# Patient Record
Sex: Male | Born: 1990 | Race: Black or African American | Hispanic: No | Marital: Single | Smoking: Current every day smoker
Health system: Southern US, Community
[De-identification: ages and names within clinical notes are randomized; demographics above are authoritative.]

## PROBLEM LIST (undated history)

## (undated) HISTORY — PX: ADENOIDECTOMY: SUR15

---
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
## 2014-03-15 ENCOUNTER — Encounter (HOSPITAL_COMMUNITY): Payer: Self-pay | Admitting: Emergency Medicine

## 2014-03-15 ENCOUNTER — Emergency Department (HOSPITAL_COMMUNITY): Payer: BC Managed Care – PPO

## 2014-03-15 ENCOUNTER — Emergency Department (HOSPITAL_COMMUNITY)
Admission: EM | Admit: 2014-03-15 | Discharge: 2014-03-15 | Disposition: A | Discharge status: Home / Self Care | Payer: BC Managed Care – PPO | Attending: Emergency Medicine | Admitting: Emergency Medicine

## 2014-03-15 DIAGNOSIS — R9389 Abnormal findings on diagnostic imaging of other specified body structures: Secondary | ICD-10-CM

## 2014-03-15 DIAGNOSIS — R1013 Epigastric pain: Secondary | ICD-10-CM | POA: Diagnosis present

## 2014-03-15 DIAGNOSIS — Z79899 Other long term (current) drug therapy: Secondary | ICD-10-CM | POA: Insufficient documentation

## 2014-03-15 DIAGNOSIS — Z880 Allergy status to penicillin: Secondary | ICD-10-CM | POA: Insufficient documentation

## 2014-03-15 DIAGNOSIS — R937 Abnormal findings on diagnostic imaging of other parts of musculoskeletal system: Secondary | ICD-10-CM | POA: Diagnosis not present

## 2014-03-15 DIAGNOSIS — Z720 Tobacco use: Secondary | ICD-10-CM | POA: Diagnosis not present

## 2014-03-15 LAB — COMPREHENSIVE METABOLIC PANEL
ALBUMIN: 3.8 g/dL (ref 3.5–5.2)
ALT: 23 U/L (ref 0–53)
AST: 18 U/L (ref 0–37)
Alkaline Phosphatase: 75 U/L (ref 39–117)
Anion gap: 12 (ref 5–15)
BILIRUBIN TOTAL: 0.7 mg/dL (ref 0.3–1.2)
BUN: 11 mg/dL (ref 6–23)
CHLORIDE: 103 meq/L (ref 96–112)
CO2: 23 meq/L (ref 19–32)
Calcium: 9.4 mg/dL (ref 8.4–10.5)
Creatinine, Ser: 0.9 mg/dL (ref 0.50–1.35)
GLUCOSE: 87 mg/dL (ref 70–99)
Potassium: 4.2 mEq/L (ref 3.7–5.3)
Sodium: 138 mEq/L (ref 137–147)
TOTAL PROTEIN: 8.2 g/dL (ref 6.0–8.3)

## 2014-03-15 LAB — CBC WITH DIFFERENTIAL/PLATELET
Basophils Absolute: 0 10*3/uL (ref 0.0–0.1)
Basophils Relative: 0 % (ref 0–1)
Eosinophils Absolute: 0.2 10*3/uL (ref 0.0–0.7)
Eosinophils Relative: 5 % (ref 0–5)
HEMATOCRIT: 44.6 % (ref 39.0–52.0)
HEMOGLOBIN: 15.5 g/dL (ref 13.0–17.0)
LYMPHS PCT: 52 % — AB (ref 12–46)
Lymphs Abs: 2.8 10*3/uL (ref 0.7–4.0)
MCH: 32 pg (ref 26.0–34.0)
MCHC: 34.8 g/dL (ref 30.0–36.0)
MCV: 92.1 fL (ref 78.0–100.0)
Monocytes Absolute: 0.7 10*3/uL (ref 0.1–1.0)
Monocytes Relative: 13 % — ABNORMAL HIGH (ref 3–12)
NEUTROS ABS: 1.6 10*3/uL — AB (ref 1.7–7.7)
Neutrophils Relative %: 30 % — ABNORMAL LOW (ref 43–77)
PLATELETS: 262 10*3/uL (ref 150–400)
RBC: 4.84 MIL/uL (ref 4.22–5.81)
RDW: 12.5 % (ref 11.5–15.5)
WBC: 5.4 10*3/uL (ref 4.0–10.5)

## 2014-03-15 MED ORDER — DICYCLOMINE HCL 20 MG PO TABS: 20.0000 mg | ORAL_TABLET | Freq: Two times a day (BID) | ORAL | Status: AC

## 2014-03-15 MED ORDER — FAMOTIDINE 20 MG PO TABS: 20.0000 mg | ORAL_TABLET | Freq: Two times a day (BID) | ORAL | Status: AC

## 2014-03-15 NOTE — ED Provider Notes (Signed)
Medical screening examination/treatment/procedure(s) were performed by non-physician practitioner and as supervising physician I was immediately available for consultation/collaboration.   EKG Interpretation None       Arby BarretteMarcy Leyda Vanderwerf, MD 03/15/14 [phone]

## 2014-03-15 NOTE — ED Provider Notes (Signed)
CSN: [id number]     Arrival date & time 03/15/14  1357 History   First MD Initiated Contact with Patient 03/15/14 1423     Chief Complaint  Patient presents with  . Abdominal Pain     (Consider location/radiation/quality/duration/timing/severity/associated sxs/prior Treatment) HPI  Tyler Garcia is a(n) 23 y.o. male who presents to the ED with CC of epigastric abdominal pain. Patient states that it began yesterday after he took a mucinex on an empty stomach. He rates it as 6/10, aching, non-radiating. It has improved today and is now a 2-10, however the patient missed work and will need a work note. He denies any nausea, vomiting, diarrhea or constipation. .Denies fevers, chills, myalgias, arthralgias. Denies DOE, SOB, chest tightness or pressure, radiation to left arm, jaw or back, or diaphoresis. Denies dysuria, flank pain, suprapubic pain, frequency, urgency, or hematuria. Denies headaches, light headedness, weakness, visual disturbances.  History reviewed. No pertinent past medical history. History reviewed. No pertinent past surgical history. History reviewed. No pertinent family history. History  Substance Use Topics  . Smoking status: Current Every Day Smoker  . Smokeless tobacco: Not on file  . Alcohol Use: No    Review of Systems  Ten systems reviewed and are negative for acute change, except as noted in the HPI.     Allergies  Penicillins  Home Medications   Prior to Admission medications   Medication Sig Start Date End Date Taking? Authorizing Provider  dextromethorphan-guaiFENesin (MUCINEX DM) 30-600 MG per 12 hr tablet Take 1 tablet by mouth 2 (two) times daily.   Yes Historical Provider, MD  pseudoephedrine (SUDAFED) 30 MG tablet Take 30 mg by mouth every 4 (four) hours as needed for congestion.   Yes Historical Provider, MD   BP 131/77  Pulse 68  Temp(Src) 98 F (36.7 C)  Resp 18  SpO2 100% Physical Exam Physical Exam  Nursing note and vitals  reviewed. Constitutional: He appears well-developed and well-nourished. No distress.  HENT:  Head: Normocephalic and atraumatic.  Eyes: Conjunctivae normal are normal. No scleral icterus.  Neck: Normal range of motion. Neck supple.  Cardiovascular: Normal rate, regular rhythm and normal heart sounds.   Pulmonary/Chest: Effort normal and breath sounds normal. No respiratory distress.  Abdominal: Soft. There is no tenderness.  Musculoskeletal: He exhibits no edema.  Neurological: He is alert.  Skin: Skin is warm and dry. He is not diaphoretic.  Psychiatric: His behavior is normal.    ED Course  Procedures (including critical care time) Labs Review Labs Reviewed  CBC WITH DIFFERENTIAL - Abnormal; Notable for the following:    Neutrophils Relative % 30 (*)    Neutro Abs 1.6 (*)    Lymphocytes Relative 52 (*)    Monocytes Relative 13 (*)    All other components within normal limits  COMPREHENSIVE METABOLIC PANEL    Imaging Review Dg Abd Acute W/chest  03/15/2014   CLINICAL DATA:  Epigastric and umbilical abdominal pain. Cough and gas congestion for 3 days. History of smoking. Initial encounter.  EXAM: ACUTE ABDOMEN SERIES (ABDOMEN 2 VIEW & CHEST 1 VIEW)  COMPARISON:  None.  FINDINGS: Normal cardiac silhouette and mediastinal contours. No focal parenchymal opacities. No pleural effusion or pneumothorax. No evidence of edema.  Paucity of bowel gas without evidence of obstruction no pneumoperitoneum, pneumatosis or portal venous gas.  No definite abnormal intra-abdominal calcifications.  No acute osseus abnormalities. Punctate lucency with surrounding sclerosis within the right femoral neck.  IMPRESSION: 1.  No acute cardiopulmonary disease.  2. Paucity of bowel gas without evidence of obstruction 3. Punctate lucency with surrounding sclerosis within the right femoral neck, potentially artifactual though on osteoid osteoma could have a similar appearance. Correlation for right hip pain is  recommended. Further evaluation could be performed with nonemergent right hip radiographs as clinically indicated.   Electronically Signed   By: Simonne Come M.D.   On: 03/15/2014 14:49     EKG Interpretation None      MDM   Final diagnoses:  Abdominal discomfort, epigastric  Abnormal radiographic examination    3:40 PM BP 129/82  Pulse 55  Temp(Src) 99.3 F (37.4 C) (Oral)  Resp 16  SpO2 100% Patient lab work up wnl. Xray with questionable lesion of R hip. No pain Patient informed to follow up for repeat radiograph. WORK NOTE TO RETURN. Bentyl and Pepcid at discharge. Return precaution discussed.     Arthor Captain, PA-C 03/15/14 1542

## 2014-03-15 NOTE — ED Notes (Signed)
Pt c/o mid abd pain since yesterday.  States some constipation.  Last BM was this morning but it was hard to go.  Denies NVD.

## 2014-03-15 NOTE — Discharge Instructions (Signed)

## 2014-08-19 ENCOUNTER — Emergency Department (HOSPITAL_COMMUNITY)
Admission: EM | Admit: 2014-08-19 | Discharge: 2014-08-19 | Discharge status: Against Medical Advice | Payer: BLUE CROSS/BLUE SHIELD | Attending: Emergency Medicine | Admitting: Emergency Medicine

## 2014-08-19 ENCOUNTER — Encounter (HOSPITAL_COMMUNITY): Payer: Self-pay

## 2014-08-19 DIAGNOSIS — R1110 Vomiting, unspecified: Secondary | ICD-10-CM | POA: Insufficient documentation

## 2014-08-19 DIAGNOSIS — Z720 Tobacco use: Secondary | ICD-10-CM | POA: Insufficient documentation

## 2014-08-19 DIAGNOSIS — R197 Diarrhea, unspecified: Secondary | ICD-10-CM | POA: Diagnosis not present

## 2014-08-19 LAB — CBC WITH DIFFERENTIAL/PLATELET
Basophils Absolute: 0 10*3/uL (ref 0.0–0.1)
Basophils Relative: 0 % (ref 0–1)
EOS PCT: 0 % (ref 0–5)
Eosinophils Absolute: 0 10*3/uL (ref 0.0–0.7)
HEMATOCRIT: 51.5 % (ref 39.0–52.0)
Hemoglobin: 17.7 g/dL — ABNORMAL HIGH (ref 13.0–17.0)
LYMPHS ABS: 0.3 10*3/uL — AB (ref 0.7–4.0)
LYMPHS PCT: 3 % — AB (ref 12–46)
MCH: 32.9 pg (ref 26.0–34.0)
MCHC: 34.4 g/dL (ref 30.0–36.0)
MCV: 95.7 fL (ref 78.0–100.0)
MONO ABS: 0.7 10*3/uL (ref 0.1–1.0)
Monocytes Relative: 7 % (ref 3–12)
Neutro Abs: 8.7 10*3/uL — ABNORMAL HIGH (ref 1.7–7.7)
Neutrophils Relative %: 90 % — ABNORMAL HIGH (ref 43–77)
Platelets: 211 10*3/uL (ref 150–400)
RBC: 5.38 MIL/uL (ref 4.22–5.81)
RDW: 12.9 % (ref 11.5–15.5)
WBC: 9.7 10*3/uL (ref 4.0–10.5)

## 2014-08-19 LAB — COMPREHENSIVE METABOLIC PANEL
ALBUMIN: 4.5 g/dL (ref 3.5–5.2)
ALT: 35 U/L (ref 0–53)
ANION GAP: 7 (ref 5–15)
AST: 25 U/L (ref 0–37)
Alkaline Phosphatase: 69 U/L (ref 39–117)
BUN: 19 mg/dL (ref 6–23)
CHLORIDE: 106 mmol/L (ref 96–112)
CO2: 25 mmol/L (ref 19–32)
Calcium: 9.1 mg/dL (ref 8.4–10.5)
Creatinine, Ser: 0.95 mg/dL (ref 0.50–1.35)
GFR calc Af Amer: >90 mL/min (ref >90)
GFR calc non Af Amer: >90 mL/min (ref >90)
Glucose, Bld: 99 mg/dL (ref 70–99)
POTASSIUM: 4.3 mmol/L (ref 3.5–5.1)
Sodium: 138 mmol/L (ref 135–145)
Total Bilirubin: 1.1 mg/dL (ref 0.3–1.2)
Total Protein: 8.3 g/dL (ref 6.0–8.3)

## 2014-08-19 LAB — LIPASE, BLOOD: LIPASE: 21 U/L (ref 11–59)

## 2014-08-19 NOTE — ED Notes (Signed)
Pt called to room w/o answer x 1.

## 2014-08-19 NOTE — ED Notes (Signed)
Pt c/o emesis and diarrhea starting around 0730.  Denies pain.  Pt denies being around anyone sick.  Pt attempted to take OTC medication, but vomited it up.

## 2014-08-19 NOTE — ED Notes (Signed)
Bed: WA04 Expected date:  Expected time:  Means of arrival:  Comments: Hold for [place] **Triage**

## 2014-08-19 NOTE — ED Notes (Signed)
Attempted to room Pt w/o response several times.  Pt did not make anyone aware that he was leaving.

## 2014-08-19 NOTE — ED Notes (Signed)
Attempted to call Pt to room x 2 w/o response.

## 2016-03-21 IMAGING — CR DG ABDOMEN ACUTE W/ 1V CHEST
3 series · 3 of 3 positions shown · non-contrast
Comparison: None.

CLINICAL DATA: Epigastric and umbilical abdominal pain. Cough and
gas congestion for 3 days. History of smoking. Initial encounter.

EXAM:
ACUTE ABDOMEN SERIES (ABDOMEN 2 VIEW & CHEST 1 VIEW)

[w chest pa]
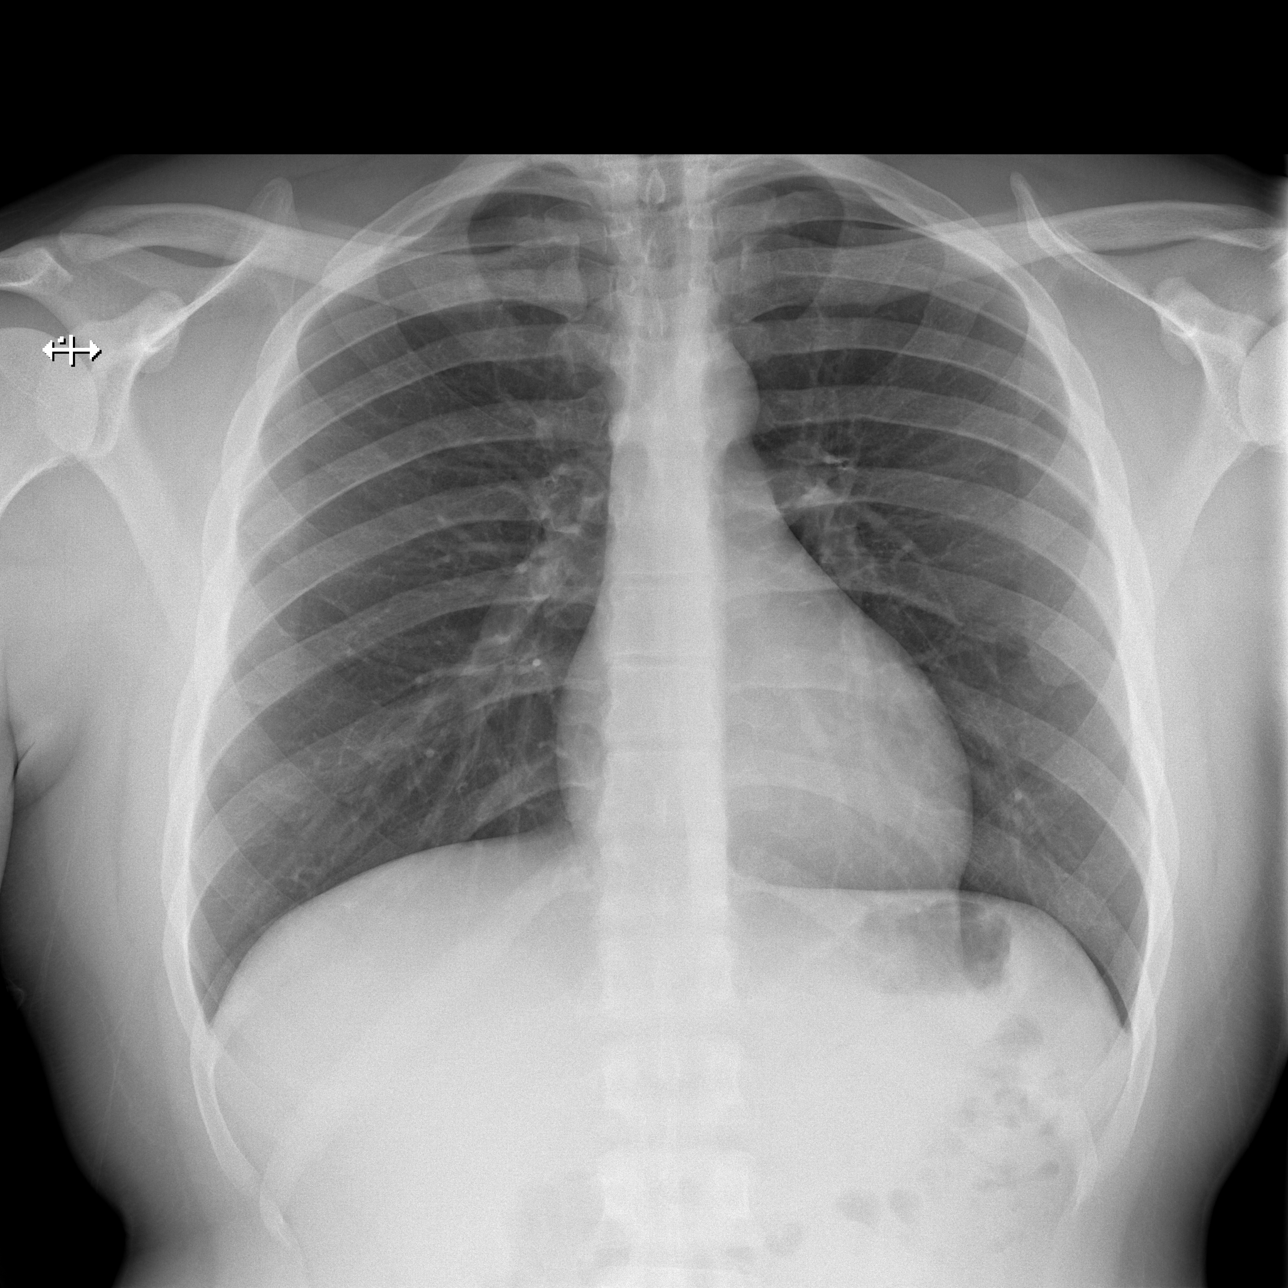

[w abdomen upright]
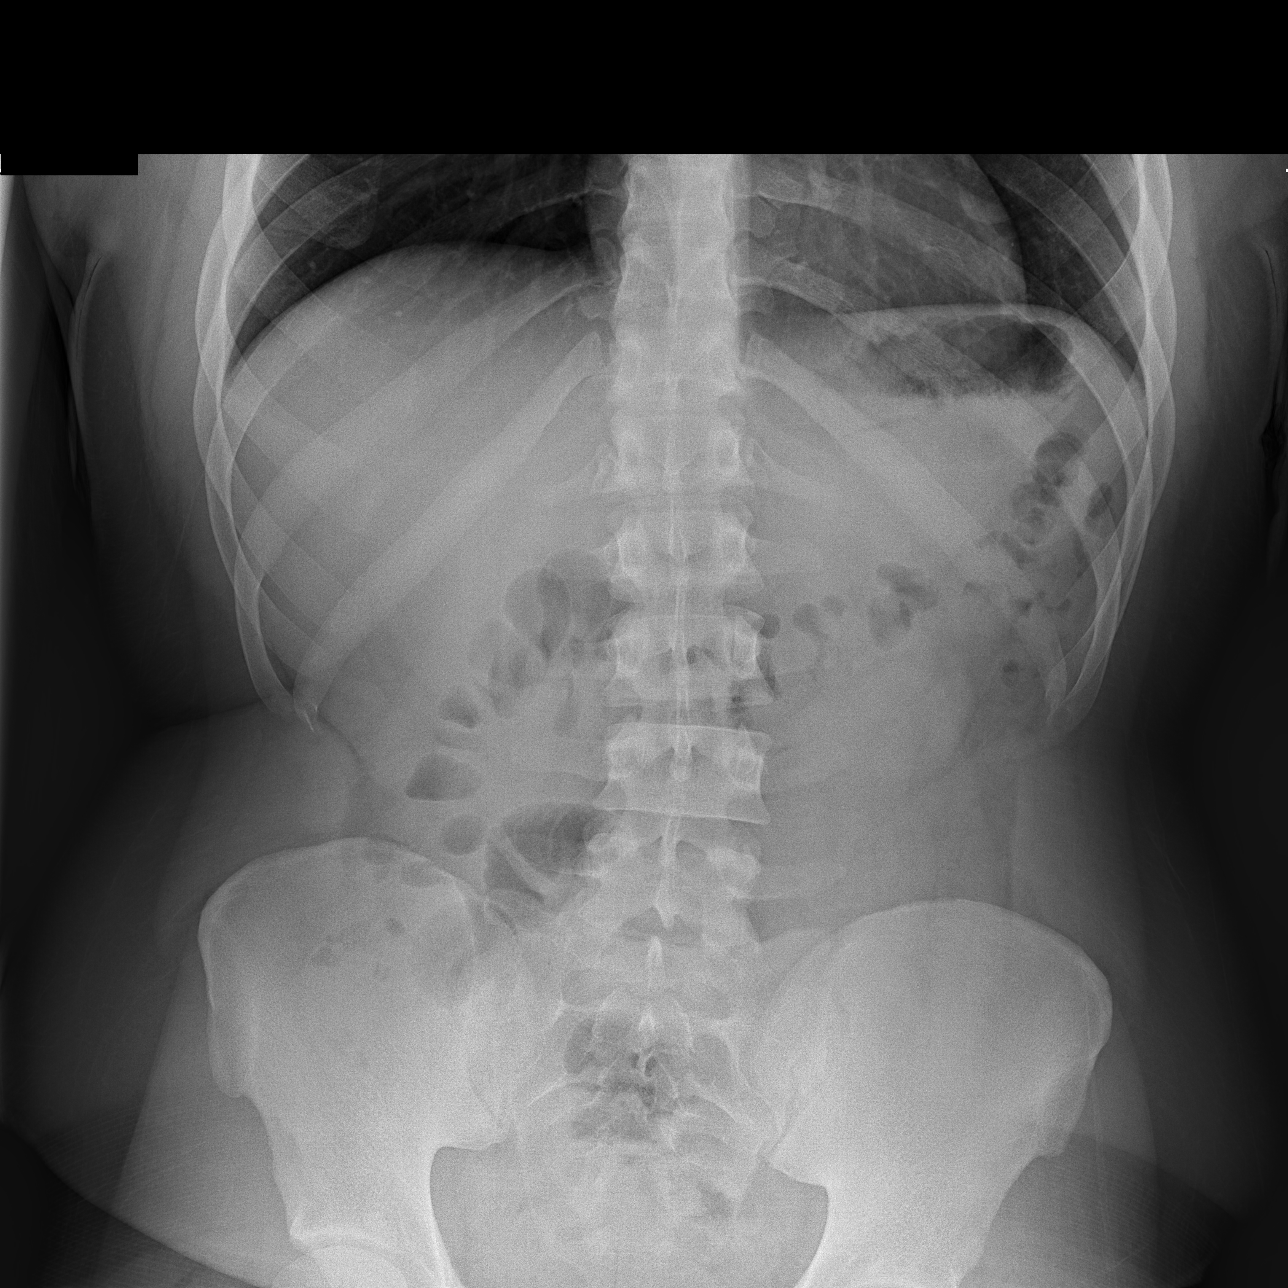

[t abdomen supine]
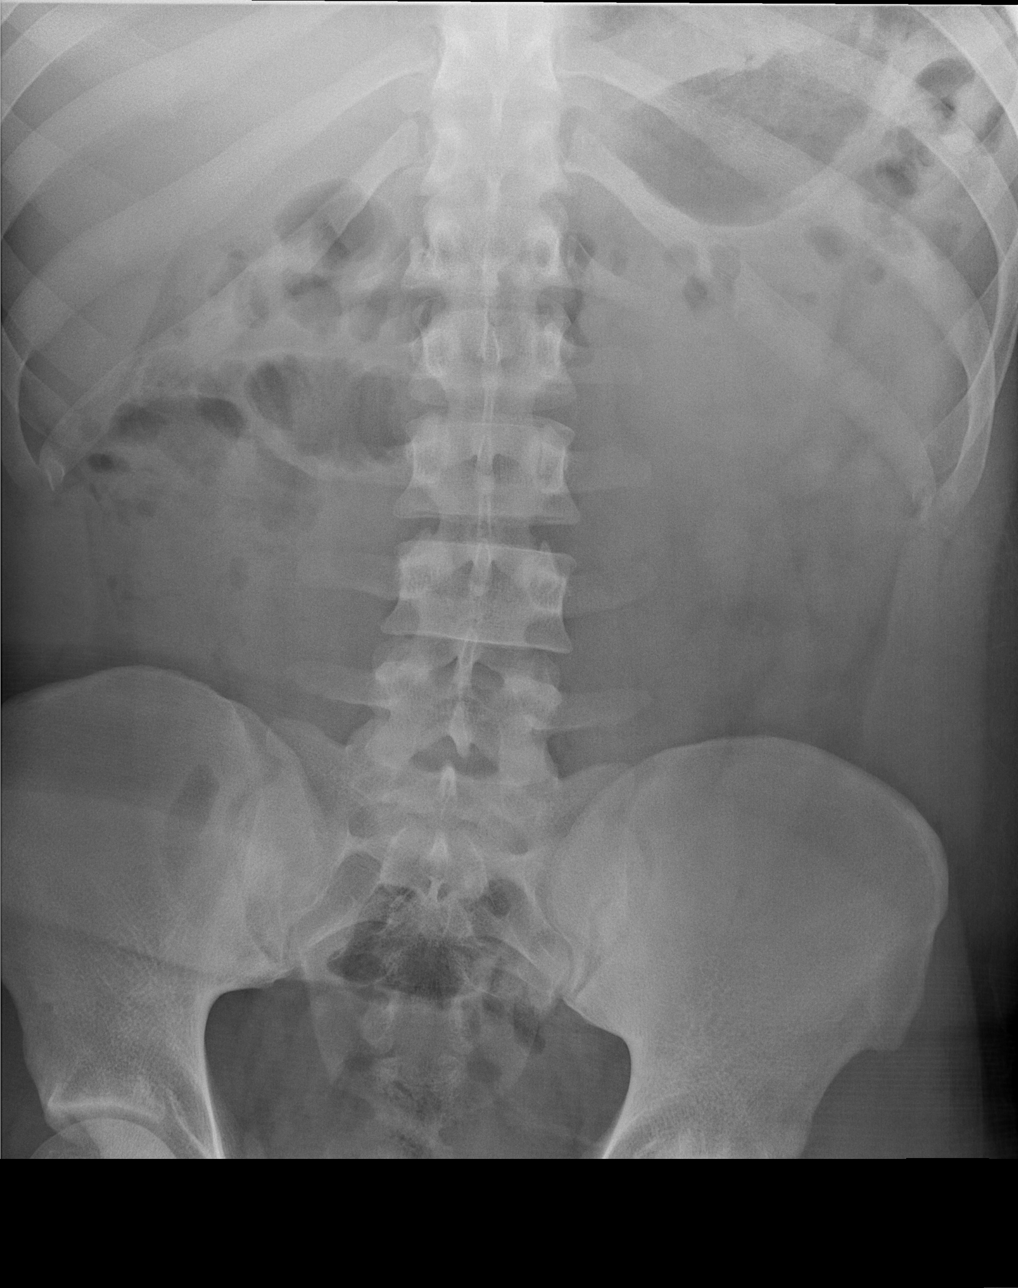

[3 of 3 positions shown; findings below may reference images not displayed]

FINDINGS: Normal cardiac silhouette and mediastinal contours. No focal
parenchymal opacities. No pleural effusion or pneumothorax. No
evidence of edema.

Paucity of bowel gas without evidence of obstruction no
pneumoperitoneum, pneumatosis or portal venous gas.

No definite abnormal intra-abdominal calcifications.

No acute osseus abnormalities. Punctate lucency with surrounding
sclerosis within the right femoral neck.
IMPRESSION: 1.  No acute cardiopulmonary disease.
2. Paucity of bowel gas without evidence of obstruction
3. Punctate lucency with surrounding sclerosis within the right
femoral neck, potentially artifactual though on osteoid osteoma
could have a similar appearance. Correlation for right hip pain is
recommended. Further evaluation could be performed with nonemergent
right hip radiographs as clinically indicated.

## 2022-09-30 ENCOUNTER — Emergency Department (HOSPITAL_COMMUNITY)
Admission: EM | Admit: 2022-09-30 | Discharge: 2022-09-30 | Disposition: A | Discharge status: Home / Self Care | Payer: BLUE CROSS/BLUE SHIELD | Attending: Emergency Medicine | Admitting: Emergency Medicine

## 2022-09-30 ENCOUNTER — Other Ambulatory Visit: Payer: Self-pay

## 2022-09-30 ENCOUNTER — Encounter (HOSPITAL_COMMUNITY): Payer: Self-pay | Admitting: *Deleted

## 2022-09-30 DIAGNOSIS — Y9319 Activity, other involving water and watercraft: Secondary | ICD-10-CM | POA: Insufficient documentation

## 2022-09-30 DIAGNOSIS — S20462A Insect bite (nonvenomous) of left back wall of thorax, initial encounter: Secondary | ICD-10-CM | POA: Insufficient documentation

## 2022-09-30 DIAGNOSIS — W57XXXA Bitten or stung by nonvenomous insect and other nonvenomous arthropods, initial encounter: Secondary | ICD-10-CM | POA: Insufficient documentation

## 2022-09-30 MED ORDER — DOXYCYCLINE HYCLATE 100 MG PO TABS
200.0000 mg | ORAL_TABLET | Freq: Once | ORAL | Status: AC
  Administered 2022-09-30: 200 mg via ORAL
  Filled 2022-09-30: qty 2

## 2022-09-30 NOTE — Discharge Instructions (Signed)
You have received a prophylactic dose of antibiotic for tick bite.  If you develop severe headache, fever, joint pain, or worsening rash please return.

## 2022-09-30 NOTE — ED Provider Notes (Signed)
[place] EMERGENCY DEPARTMENT AT [facility] Provider Note   CSN: [id number] Arrival date & time: 09/30/22  1809     History  Chief Complaint  Patient presents with   Insect Bite    Tyler Garcia is a 32 y.o. male.  The history is provided by the patient and medical records. No language interpreter was used.     32 year old male presenting with concerns of tick exposure.  Patient went fishing yesterday, felt like he was bitten by something to his back.  His inspect his entire body and did not see anything and this morning he asked his wife to check his back and she noticed 2 small ticks that they were able to removed from his back.  Patient states he feels a bit off including having headache and lightheadedness.  States that he has allergic reaction to tick bite in the past.  He denies any joint pain fever neck stiffness severe headache tongue swelling abdominal cramping or shortness of breath.  Home Medications Prior to Admission medications   Medication Sig Start Date End Date Taking? Authorizing Provider  dicyclomine (BENTYL) 20 MG tablet Take 1 tablet (20 mg total) by mouth 2 (two) times daily. Patient not taking: Reported on 09/30/2022 03/15/14   Arthor Captain, PA-C  famotidine (PEPCID) 20 MG tablet Take 1 tablet (20 mg total) by mouth 2 (two) times daily. Patient not taking: Reported on 09/30/2022 03/15/14   Arthor Captain, PA-C      Allergies    Penicillins    Review of Systems   Review of Systems  All other systems reviewed and are negative.   Physical Exam Updated Vital Signs BP 139/88 (BP Location: Right Arm)   Pulse 90   Temp 99.7 F (37.6 C)   Resp 18   Ht  (1.778 m)   Wt 117.9 kg   SpO2 99%   BMI 37.31 kg/m  Physical Exam Vitals and nursing note reviewed.  Constitutional:      General: He is not in acute distress.    Appearance: He is well-developed.  HENT:     Head: Atraumatic.  Eyes:     Conjunctiva/sclera: Conjunctivae normal.   Cardiovascular:     Rate and Rhythm: Normal rate and regular rhythm.     Pulses: Normal pulses.     Heart sounds: Normal heart sounds.  Abdominal:     Palpations: Abdomen is soft.  Musculoskeletal:     Cervical back: Neck supple.  Skin:    Findings: Rash (Back: There is a localized skin irritation region measuring less than 1cm to L lateral upper back) present.  Neurological:     Mental Status: He is alert.     ED Results / Procedures / Treatments   Labs (all labs ordered are listed, but only abnormal results are displayed) Labs Reviewed - No data to display  EKG None  Radiology No results found.  Procedures Procedures    Medications Ordered in ED Medications  doxycycline (VIBRA-TABS) tablet 200 mg (has no administration in time range)    ED Course/ Medical Decision Making/ A&P                             Medical Decision Making Risk Prescription drug management.   BP 139/88 (BP Location: Right Arm)   Pulse 90   Temp 99.7 F (37.6 C)   Resp 18   Ht  (1.778 m)   Wt  117.9 kg   SpO2 99%   BMI 37.31 kg/m   50:34 PM 32 year old male presenting with concerns of tick exposure.  Patient went fishing yesterday, felt like he was bitten by something to his back.  His inspect his entire body and did not see anything and this morning he asked his wife to check his back and she noticed 2 small ticks that they were able to removed from his back.  Patient states he feels a bit off including having headache and lightheadedness.  States that he has allergic reaction to tick bite in the past.  He denies any joint pain fever neck stiffness severe headache tongue swelling abdominal cramping or shortness of breath.  On exam this is a well-appearing male resting comfortably appears to be in no acute discomfort.  Skin exam remarkable for 2 localized skin irritation noted to left upper lateral back without any bull's-eye abscess, or retained tick body part.  Vital signs  reviewed and overall reassuring.  Due to tick exposure, will give prophylactic doxycycline 200 mg once.  Return precaution given.  Patient agrees to plan.  No evidence of anaphylactic reaction.        Final Clinical Impression(s) / ED Diagnoses Final diagnoses:  Tick bite of left back wall of thorax, initial encounter    Rx / DC Orders ED Discharge Orders     None         Fayrene Helper, PA-C 09/30/22 2042    Charlynne Pander, MD 09/30/22 [phone]

## 2022-09-30 NOTE — ED Triage Notes (Addendum)
Pt noticed a tick on him this morning after fishing yesterday. 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 Tick has been removed; pt c/o not feeling well today (headache and lightheadness). Small red swollen area noted below L axilla. x2
